# Patient Record
Sex: Female | Born: 2000 | Race: Black or African American | Hispanic: No | Marital: Single | Smoking: Never smoker
Health system: Southern US, Community
[De-identification: ages and names within clinical notes are randomized; demographics above are authoritative.]

## PROBLEM LIST (undated history)

## (undated) DIAGNOSIS — Z789 Other specified health status: Secondary | ICD-10-CM

## (undated) HISTORY — PX: TYMPANOSTOMY TUBE PLACEMENT: SHX32

---

## 2018-11-25 ENCOUNTER — Encounter (HOSPITAL_COMMUNITY): Payer: Self-pay | Admitting: Emergency Medicine

## 2018-11-25 ENCOUNTER — Other Ambulatory Visit: Payer: Self-pay

## 2018-11-25 ENCOUNTER — Emergency Department (HOSPITAL_COMMUNITY)
Admission: EM | Admit: 2018-11-25 | Discharge: 2018-11-25 | Disposition: A | Discharge status: Home / Self Care | Payer: Medicaid Other | Attending: Emergency Medicine | Admitting: Emergency Medicine

## 2018-11-25 DIAGNOSIS — K0889 Other specified disorders of teeth and supporting structures: Secondary | ICD-10-CM

## 2018-11-25 DIAGNOSIS — K047 Periapical abscess without sinus: Secondary | ICD-10-CM

## 2018-11-25 MED ORDER — PENICILLIN V POTASSIUM 250 MG PO TABS
500.0000 mg | ORAL_TABLET | Freq: Once | ORAL | Status: AC
  Administered 2018-11-25: 500 mg via ORAL
  Filled 2018-11-25: qty 2

## 2018-11-25 MED ORDER — NAPROXEN 500 MG PO TABS: 500.0000 mg | ORAL_TABLET | Freq: Two times a day (BID) | ORAL | 0 refills | Status: DC

## 2018-11-25 MED ORDER — NAPROXEN 250 MG PO TABS
500.0000 mg | ORAL_TABLET | Freq: Once | ORAL | Status: AC
  Administered 2018-11-25: 21:00:00 500 mg via ORAL
  Filled 2018-11-25: qty 2

## 2018-11-25 MED ORDER — PENICILLIN V POTASSIUM 500 MG PO TABS: 500.0000 mg | ORAL_TABLET | Freq: Four times a day (QID) | ORAL | 0 refills | Status: AC

## 2018-11-25 NOTE — ED Provider Notes (Signed)
[facility] Provider Note   CSN: [id number] Arrival date & time: 11/25/18  1910    History   Chief Complaint Chief Complaint  Patient presents with  . Dental Pain    HPI Laurie Jenkins is a 18 y.o. female presents for evaluation of left upper dental pain.  She reports she has had pain in the left upper tooth for a while but states that she feels like it worsened today.  She was told that she had a crack in her tooth and that would eventually need a root canal.  She states that she has not been able to get an appointment with her dentist and states that the earliest they will consider her is 12/16/2018.  She comes into the ED today because she feels like the pain has acutely worsened.  She states it is worse when she has cold liquid over the tooth.  She has been able to eat and drink.  She has taken Tylenol with no improvement in pain.  She is also applying Orajel and clove oil with no improvement.  She has not noted any fevers.  She has not any facial swelling, difficulty breathing, vomiting.     The history is provided by the patient.    History reviewed. No pertinent past medical history.  There are no active problems to display for this patient.   History reviewed. No pertinent surgical history.   OB History   No obstetric history on file.      Home Medications    Prior to Admission medications   Medication Sig Start Date End Date Taking? Authorizing Provider  naproxen (NAPROSYN) 500 MG tablet Take 1 tablet (500 mg total) by mouth 2 (two) times daily. 11/25/18   Graciella FreerLayden, Rhealyn Cullen A, PA-C  penicillin v potassium (VEETID) 500 MG tablet Take 1 tablet (500 mg total) by mouth 4 (four) times daily for 7 days. 11/25/18 12/02/18  Maxwell CaulLayden, Dajaun Goldring A, PA-C    Family History No family history on file.  Social History Social History   Tobacco Use  . Smoking status: Not on file  Substance Use Topics  . Alcohol use: Not on file  . Drug use:  Not on file     Allergies   Patient has no known allergies.   Review of Systems Review of Systems  Constitutional: Negative for fever.  HENT: Positive for dental problem.   Respiratory: Negative for shortness of breath.   Gastrointestinal: Negative for vomiting.  All other systems reviewed and are negative.    Physical Exam Updated Vital Signs BP 124/83   Pulse 80   Temp 98.2 F (36.8 C) (Oral)   Resp 18   SpO2 100%   Physical Exam Vitals signs and nursing note reviewed.  Constitutional:      Appearance: She is well-developed.  HENT:     Head: Normocephalic and atraumatic.     Comments: Face is symmetric in appearance with no overlying warmth, erythema, edema.    Mouth/Throat:     Pharynx: Oropharynx is clear.      Comments: Airway is patent, phonation is intact. Uvula is midline.  No trismus. Eyes:     General: No scleral icterus.       Right eye: No discharge.        Left eye: No discharge.     Conjunctiva/sclera: Conjunctivae normal.  Pulmonary:     Effort: Pulmonary effort is normal.  Skin:    General: Skin is warm and dry.  Neurological:     Mental Status: She is alert.  Psychiatric:        Speech: Speech normal.        Behavior: Behavior normal.      ED Treatments / Results  Labs (all labs ordered are listed, but only abnormal results are displayed) Labs Reviewed - No data to display  EKG None  Radiology No results found.  Procedures Procedures (including critical care time)  Medications Ordered in ED Medications  naproxen (NAPROSYN) tablet 500 mg (500 mg Oral Given 11/25/18 2045)  penicillin v potassium (VEETID) tablet 500 mg (500 mg Oral Given 11/25/18 2045)     Initial Impression / Assessment and Plan / ED Course  I have reviewed the triage vital signs and the nursing notes.  Pertinent labs & imaging results that were available during my care of the patient were reviewed by me and considered in my medical decision making (see  chart for details).        18 y.o. F  presents with weeks of dental pain that worsened today. No evidence of abscess requiring immediate incision and drainage. Exam not concerning for Ludwig's angina or pharyngeal abscess. Will treat with Penicillin. Patient with no known drug allergies.  I did offer patient dental block here in the ED but she declined.  Patient instructed to follow-up with dentist referral provided. Stable for discharge at this time. Strict return precautions discussed. Patient expresses understanding and agreement to plan.  Portions of this note were generated with [employer]. Dictation errors may occur despite best attempts at proofreading.  Final Clinical Impressions(s) / ED Diagnoses   Final diagnoses:  Pain, dental  Dental abscess    ED Discharge Orders         Ordered    penicillin v potassium (VEETID) 500 MG tablet  4 times daily     11/25/18 2032    naproxen (NAPROSYN) 500 MG tablet  2 times daily     11/25/18 2032           Desma Mcgregor 11/25/18 2242    Margette Fast, MD 11/26/18 [phone]

## 2018-11-25 NOTE — ED Triage Notes (Signed)
Pt reports Left upper dental pain that has been ongoing for a while, worsened today. States she was told she needs a root canal but cannot have it done until September. Has tried otc pain meds and orajel without relief. Pt tearful during triage.

## 2018-11-25 NOTE — ED Notes (Signed)
Discharge instructions discussed with pt pt verbalized understanding no questions at this time

## 2018-11-25 NOTE — Discharge Instructions (Signed)
Take antibiotics as directed. Please take all of your antibiotics until finished.  Take naproxen as directed.  The exam and treatment you received today has been provided on an emergency basis only. This is not a substitute for complete medical or dental care. If your problem worsens or new symptoms (problems) appear, and you are unable to arrange prompt follow-up care with your dentist, call or return to this location. If you do not have a dentist, please follow-up with one on the list provided  CALL YOUR DENTIST OR RETURN IMMEDIATELY IF you develop a fever, rash, difficulty breathing or swallowing, neck or facial swelling, or other potentially serious concerns.   Please follow-up with one of the dental clinics provided to you below or in your paperwork. Call and tell them you were seen in the Emergency Dept and arrange for an appointment. You may have to call multiple places in order to find a place to be seen.  Dental Assistance If the dentist on-call cannot see you, please use the resources below:   Patients with Medicaid: [place] [facility] [place], [place] [address], [phone]  If unable to pay, or uninsured, contact [facility] [phone]) or [place] [phone] in [place], [place] in [facility]) to become qualified for the adult dental clinic  Other [place]- [place], [place], [place], [address]    [phone], Ext. [phone]    2nd and 4th Thursday of the month at 6:30am    10 clients each day by appointment, can sometimes see walk-in     patients if someone does not show for an appointment [place], [place], [place], [address]    [phone] [facility]- [address], [place], [place], [address]    [phone]  [place] Department- [phone] [facility]  Department- [phone]

## 2018-12-24 ENCOUNTER — Encounter (HOSPITAL_COMMUNITY): Payer: Self-pay | Admitting: Emergency Medicine

## 2018-12-24 ENCOUNTER — Other Ambulatory Visit: Payer: Self-pay

## 2018-12-24 ENCOUNTER — Emergency Department (HOSPITAL_COMMUNITY)
Admission: EM | Admit: 2018-12-24 | Discharge: 2018-12-24 | Disposition: A | Discharge status: Home / Self Care | Payer: Medicaid Other | Attending: Emergency Medicine | Admitting: Emergency Medicine

## 2018-12-24 DIAGNOSIS — K0889 Other specified disorders of teeth and supporting structures: Secondary | ICD-10-CM | POA: Insufficient documentation

## 2018-12-24 DIAGNOSIS — M67432 Ganglion, left wrist: Secondary | ICD-10-CM | POA: Insufficient documentation

## 2018-12-24 DIAGNOSIS — M6740 Ganglion, unspecified site: Secondary | ICD-10-CM

## 2018-12-24 MED ORDER — PENICILLIN V POTASSIUM 500 MG PO TABS: 500.0000 mg | ORAL_TABLET | Freq: Four times a day (QID) | ORAL | 0 refills | Status: AC

## 2018-12-24 MED ORDER — TRAMADOL HCL 50 MG PO TABS: 50.0000 mg | ORAL_TABLET | Freq: Four times a day (QID) | ORAL | 0 refills | Status: DC | PRN

## 2018-12-24 NOTE — ED Notes (Signed)
Updated on wait for treatment room. 

## 2018-12-24 NOTE — ED Provider Notes (Signed)
[facility]   CSN: [id number] Arrival date & time: 12/24/18  1718     History   Chief Complaint Chief Complaint  Patient presents with  . Dental Pain  . Cyst    HPI Laurie Jenkins is a 18 y.o. female.     HPI   [age] YOF with several days or bilateralupper dental pina. This stated on left posterior. She has had this before and was prescribed antibiotics which helped her symptoms. She denies fever, swelling, or trauma. She has an upcoming dental appointment.   Also has a nodule on her left dorsal wrist at the base of the thumb. Minor tenderness to palpation. No redness fever or warmth.   Dental pain bialteral ganglion cyst left wrist History reviewed. No pertinent past medical history.  There are no active problems to display for this patient.   History reviewed. No pertinent surgical history.   OB History   No obstetric history on file.      Home Medications    Prior to Admission medications   Medication Sig Start Date End Date Taking? Authorizing Provider  naproxen (NAPROSYN) 500 MG tablet Take 1 tablet (500 mg total) by mouth 2 (two) times daily. 11/25/18   Graciella FreerLayden, Lindsey A, PA-C  penicillin v potassium (VEETID) 500 MG tablet Take 1 tablet (500 mg total) by mouth 4 (four) times daily for 7 days. 12/24/18 12/31/18  Dilpreet Faires, Tinnie GensJeffrey, PA-C  traMADol (ULTRAM) 50 MG tablet Take 1 tablet (50 mg total) by mouth every 6 (six) hours as needed. 12/24/18   Eyvonne MechanicHedges, Garnette Greb, PA-C    Family History No family history on file.  Social History Social History   Tobacco Use  . Smoking status: Never Smoker  . Smokeless tobacco: Never Used  Substance Use Topics  . Alcohol use: Never    Frequency: Never  . Drug use: Never     Allergies   Patient has no known allergies.   Review of Systems Review of Systems  All other systems reviewed and are negative.    Physical Exam Updated Vital Signs BP 115/74 (BP Location:  Right Arm)   Pulse 73   Temp 98.4 F (36.9 C) (Oral)   Resp 16   LMP 11/29/2018   SpO2 100%   Physical Exam Vitals signs and nursing note reviewed.  Constitutional:      Appearance: She is well-developed.  HENT:     Head: Normocephalic and atraumatic.     Comments: Dentition within normal limits - no gumline swelling or tenderness- floor of the mouth soft Eyes:     General: No scleral icterus.       Right eye: No discharge.        Left eye: No discharge.     Conjunctiva/sclera: Conjunctivae normal.     Pupils: Pupils are equal, round, and reactive to light.  Neck:     Musculoskeletal: Normal range of motion.     Vascular: No JVD.     Trachea: No tracheal deviation.  Pulmonary:     Effort: Pulmonary effort is normal.     Breath sounds: No stridor.  Musculoskeletal:     Comments: Small mobile nodule to left dorsal wrist at the base of the thumb  Neurological:     Mental Status: She is alert and oriented to person, place, and time.     Coordination: Coordination normal.  Psychiatric:        Behavior: Behavior normal.  Thought Content: Thought content normal.        Judgment: Judgment normal.      ED Treatments / Results  Labs (all labs ordered are listed, but only abnormal results are displayed) Labs Reviewed - No data to display  EKG None  Radiology No results found.  Procedures Procedures (including critical care time)  Medications Ordered in ED Medications - No data to display   Initial Impression / Assessment and Plan / ED Course  I have reviewed the triage vital signs and the nursing notes.  Pertinent labs & imaging results that were available during my care of the patient were reviewed by me and considered in my medical decision making (see chart for details).        Patient with dental pain, no swelling or obvious abscess but given history of presumed infection previously and similar pain with treat with antibiotic. She is not preg or  breastfeeding. She also has ganglion cyst. She will be referred to hand specialist. Return precautions given. Will follow up with dentist.   Final Clinical Impressions(s) / ED Diagnoses   Final diagnoses:  Ganglion cyst  Pain, dental    ED Discharge Orders         Ordered    penicillin v potassium (VEETID) 500 MG tablet  4 times daily     12/24/18 2200    traMADol (ULTRAM) 50 MG tablet  Every 6 hours PRN     12/24/18 2200           Okey Regal, PA-C 12/27/18 1001    Drenda Freeze, MD 12/30/18 [phone]

## 2018-12-24 NOTE — Discharge Instructions (Addendum)
Please read attached information. If you experience any new or worsening signs or symptoms please return to the emergency room for evaluation. Please follow-up with your primary care provider or specialist as discussed. Please use medication prescribed only as directed and discontinue taking if you have any concerning signs or symptoms.   °

## 2018-12-24 NOTE — ED Triage Notes (Signed)
C/o bilateral upper dental pain x several weeks-1 month.  Also reports "knot' on L wrist that she noticed yesterday.

## 2019-01-14 ENCOUNTER — Emergency Department (HOSPITAL_COMMUNITY)
Admission: EM | Admit: 2019-01-14 | Discharge: 2019-01-14 | Disposition: A | Discharge status: Home / Self Care | Payer: Medicaid Other | Attending: Emergency Medicine | Admitting: Emergency Medicine

## 2019-01-14 ENCOUNTER — Other Ambulatory Visit: Payer: Self-pay

## 2019-01-14 DIAGNOSIS — K0889 Other specified disorders of teeth and supporting structures: Secondary | ICD-10-CM | POA: Diagnosis present

## 2019-01-14 DIAGNOSIS — Z79899 Other long term (current) drug therapy: Secondary | ICD-10-CM | POA: Diagnosis not present

## 2019-01-14 MED ORDER — PENICILLIN V POTASSIUM 500 MG PO TABS: 500.0000 mg | ORAL_TABLET | Freq: Four times a day (QID) | ORAL | 0 refills | Status: AC

## 2019-01-14 MED ORDER — FLUCONAZOLE 100 MG PO TABS: 200.0000 mg | ORAL_TABLET | Freq: Every day | ORAL | 0 refills | Status: AC

## 2019-01-14 NOTE — ED Triage Notes (Signed)
Pt here for evaluation of dental pain x several weeks. Pt sts she needs a couple root canals and has a problem with her wisdom tooth. Pt has been using oragel and advil without relief. Is unable to call to make a dentist appointment until tomorrow.

## 2019-01-14 NOTE — Discharge Instructions (Addendum)
Take 600 mg of Advil liquid gels with 500 mg of Tylenol every 6 hours for pain. Rinse with Listerine after every meal. Take penicillin as prescribed and complete the full course. Eat yogurt daily or take Culturelle probiotic to prevent a yeast infection. If you develop a yeast infection, take one Diflucan pill.  Repeat in 1 week if you continue to have symptoms.

## 2019-01-14 NOTE — ED Provider Notes (Signed)
[facility] EMERGENCY DEPARTMENT Provider Note   CSN: [id number] Arrival date & time: 01/14/19  1627     History   Chief Complaint Chief Complaint  Patient presents with  . Dental Pain    HPI Laurie Jenkins is a 18 y.o. female.     18yo female with left and right upper dental pain, ongoing for several weeks, progressively worsening.  Denies fever, drainage, trauma.  No pain with opening her mouth.  No other complaints or concerns.  Patient states that she is supposed to have a root canal on either upper molar, plans to call her dentist in the morning to get this scheduled.     No past medical history on file.  There are no active problems to display for this patient.   No past surgical history on file.   OB History   No obstetric history on file.      Home Medications    Prior to Admission medications   Medication Sig Start Date End Date Taking? Authorizing Provider  fluconazole (DIFLUCAN) 100 MG tablet Take 2 tablets (200 mg total) by mouth daily for 2 days. Take 1 tablet if you develop a yeast infection. Repeat dose in 1 week if symptoms continue. 01/14/19 01/16/19  Tacy Learn, PA-C  naproxen (NAPROSYN) 500 MG tablet Take 1 tablet (500 mg total) by mouth 2 (two) times daily. 11/25/18   [place] Lanius A, PA-C  penicillin v potassium (VEETID) 500 MG tablet Take 1 tablet (500 mg total) by mouth 4 (four) times daily for 7 days. 01/14/19 01/21/19  Tacy Learn, PA-C  traMADol (ULTRAM) 50 MG tablet Take 1 tablet (50 mg total) by mouth every 6 (six) hours as needed. 12/24/18   Okey Regal, PA-C    Family History No family history on file.  Social History Social History   Tobacco Use  . Smoking status: Never Smoker  . Smokeless tobacco: Never Used  Substance Use Topics  . Alcohol use: Never    Frequency: Never  . Drug use: Never     Allergies   Patient has no known allergies.   Review of Systems Review of Systems   Constitutional: Negative for chills and fever.  HENT: Positive for dental problem. Negative for trouble swallowing and voice change.   Gastrointestinal: Negative for vomiting.  Musculoskeletal: Negative for neck pain and neck stiffness.  Skin: Negative for rash and wound.  Allergic/Immunologic: Negative for immunocompromised state.  Neurological: Negative for headaches.  Hematological: Negative for adenopathy.  Psychiatric/Behavioral: Negative for confusion.  All other systems reviewed and are negative.    Physical Exam Updated Vital Signs BP 132/77 (BP Location: Right Arm)   Pulse 90   Temp 98.4 F (36.9 C) (Oral)   Resp 16   SpO2 100%   Physical Exam Vitals signs and nursing note reviewed.  Constitutional:      General: She is not in acute distress.    Appearance: She is well-developed. She is not diaphoretic.  HENT:     Head: Normocephalic and atraumatic.     Jaw: No trismus.     Mouth/Throat:     Mouth: Mucous membranes are moist.     Pharynx: Oropharynx is clear. No oropharyngeal exudate or posterior oropharyngeal erythema.      Comments: Decay to left and right upper molars without obvious abscess, drainage, gingival swelling. Pulmonary:     Effort: Pulmonary effort is normal.  Neurological:     Mental Status: She is alert and  oriented to person, place, and time.  Psychiatric:        Behavior: Behavior normal.      ED Treatments / Results  Labs (all labs ordered are listed, but only abnormal results are displayed) Labs Reviewed - No data to display  EKG None  Radiology No results found.  Procedures Procedures (including critical care time)  Medications Ordered in ED Medications - No data to display   Initial Impression / Assessment and Plan / ED Course  I have reviewed the triage vital signs and the nursing notes.  Pertinent labs & imaging results that were available during my care of the patient were reviewed by me and considered in my medical  decision making (see chart for details).  Clinical Course as of Jan 14 2028  Wed Jan 14, 2019  2642 18 year old female with bilateral upper tooth pain progressively worsening over the past several weeks.  Denies trauma, fever, drainage.  Patient states she supposed have a root canal and needs to call her dentist and will do so for second the morning.  On exam patient has decay to upper molars without obvious abscess.  Patient was placed on penicillin, recommend Advil and Tylenol for pain.  Given Diflucan in case she develops a yeast infection as requested.   [LM]    Clinical Course User Index [LM] Jeannie Fend, PA-C      Final Clinical Impressions(s) / ED Diagnoses   Final diagnoses:  Pain, dental    ED Discharge Orders         Ordered    penicillin v potassium (VEETID) 500 MG tablet  4 times daily     01/14/19 2026    fluconazole (DIFLUCAN) 100 MG tablet  Daily     01/14/19 2026           Jeannie Fend, PA-C 01/14/19 2029    Alvira Monday, MD 01/15/19 1227

## 2019-01-18 ENCOUNTER — Encounter (HOSPITAL_COMMUNITY): Payer: Self-pay | Admitting: Emergency Medicine

## 2019-01-18 ENCOUNTER — Other Ambulatory Visit: Payer: Self-pay

## 2019-01-18 ENCOUNTER — Emergency Department (HOSPITAL_COMMUNITY)
Admission: EM | Admit: 2019-01-18 | Discharge: 2019-01-18 | Disposition: A | Discharge status: Home / Self Care | Payer: Medicaid Other | Attending: Emergency Medicine | Admitting: Emergency Medicine

## 2019-01-18 DIAGNOSIS — K0889 Other specified disorders of teeth and supporting structures: Secondary | ICD-10-CM | POA: Diagnosis present

## 2019-01-18 DIAGNOSIS — K029 Dental caries, unspecified: Secondary | ICD-10-CM | POA: Diagnosis not present

## 2019-01-18 MED ORDER — HYDROCODONE-ACETAMINOPHEN 5-325 MG PO TABS
2.0000 | ORAL_TABLET | Freq: Once | ORAL | Status: AC
  Administered 2019-01-18: 2 via ORAL
  Filled 2019-01-18: qty 2

## 2019-01-18 MED ORDER — CHLORHEXIDINE GLUCONATE 0.12 % MT SOLN: 15.0000 mL | Freq: Two times a day (BID) | OROMUCOSAL | 0 refills | Status: DC

## 2019-01-18 NOTE — ED Triage Notes (Signed)
Pt. Stated, Donnald Garre had a toothache, I was here a couple of days ago.I was given antibiotic and Im still in pain.

## 2019-01-18 NOTE — Discharge Instructions (Addendum)
You have been diagnosed today with dental pain due to dental caries.  At this time there does not appear to be the presence of an emergent medical condition, however there is always the potential for conditions to change. Please read and follow the below instructions.  Please return to the Emergency Department immediately for any new or worsening symptoms or if your symptoms do not improve within 3 days. Please be sure to follow up with your Primary Care Provider within one week regarding your visit today; please call their office to schedule an appointment even if you are feeling better for a follow-up visit. You have been given pain medication today called Norco.  Do not drive or operate machinery for the rest of the day as it will make you drowsy.  Do not drink alcohol or take other sedating medications today as this may worsen side effects. Call the dentist Dr. Drinda Butts office on your discharge paperwork after you leave the emergency department today to schedule an "emergency department follow-up visit.".  You need to be seen by a dentist for definitive dental care. Please continue to take your antibiotic penicillin as previously prescribed until complete to avoid dental infection. You may use the mouth rinse Peridex as prescribed to help with your symptoms.  Do not swallow Peridex.  Rinse and spit this medication.  Get help right away if: You cannot open your mouth. You are having trouble breathing or swallowing. You have a fever. Your face, neck, or jaw is swollen. You have any new/concerning or worsening symptoms  Please read the additional information packets attached to your discharge summary.  Do not take your medicine if  develop an itchy rash, swelling in your mouth or lips, or difficulty breathing; call 911 and seek immediate emergency medical attention if this occurs.  Note: Portions of this text may have been transcribed using voice recognition software. Every effort was made  to ensure accuracy; however, inadvertent computerized transcription errors may still be present.

## 2019-01-18 NOTE — ED Notes (Signed)
Patient called and stated that she was supposed to get Vicodin but it wasn't called in. I called and spoke to Margrett Rud and he said he didn't tell her that he was calling narcotics called in as he doesn't give narcotics for tooth pain. She kept going on and on about getting narcotics. She said the nurse told her that she would get some. She kept asking what she should do as she had gotten them before for tooth pain. I recommended calling a dentist tomorrow morning and she stated " We aren't all privileged enough to do that." She then hung up on me.

## 2019-01-18 NOTE — ED Provider Notes (Signed)
[facility] EMERGENCY DEPARTMENT Provider Note   CSN: [id number] Arrival date & time: 01/18/19  0740     History   Chief Complaint Chief Complaint  Patient presents with  . Dental Pain    HPI Laurie Jenkins is a 18 y.o. female otherwise healthy presents today for bilateral upper dental pain that has been present for approximately 1-1/2 months.  Patient originally presented for pain on 11/25/2018, she attempted to follow-up with general dentistry however they have been unable to get an appointment.  On 11/25/2018 she was treated with Pen VK and naproxen with temporary improvement of symptoms.  She again presented on 01/14/2019 for the same symptoms, treated with Pen VK and again discharged.  She reports that she did not fill the second prescription until yesterday, reports pain is now constant severe bilateral upper dental pain worsened with chewing and temporarily relieved with Advil, denies radiation of pain.  Patient denies fever/chills, eye pain, facial swelling, difficulty swallowing, drooling, voice change, nausea/vomiting, abdominal pain or any additional concerns.     HPI  History reviewed. No pertinent past medical history.  There are no active problems to display for this patient.   History reviewed. No pertinent surgical history.   OB History   No obstetric history on file.      Home Medications    Prior to Admission medications   Medication Sig Start Date End Date Taking? Authorizing Provider  ibuprofen (ADVIL) 200 MG tablet Take 600 mg by mouth every 6 (six) hours as needed for mild pain or moderate pain.   Yes [provider]  penicillin v potassium (VEETID) 500 MG tablet Take 1 tablet (500 mg total) by mouth 4 (four) times daily for 7 days. 01/14/19 01/21/19 Yes Jeannie Fend, PA-C  chlorhexidine (PERIDEX) 0.12 % solution Use as directed 15 mLs in the mouth or throat 2 (two) times daily. 01/18/19   Harlene Salts A, PA-C  naproxen  (NAPROSYN) 500 MG tablet Take 1 tablet (500 mg total) by mouth 2 (two) times daily. Patient not taking: Reported on 01/18/2019 11/25/18   Graciella Freer A, PA-C  traMADol (ULTRAM) 50 MG tablet Take 1 tablet (50 mg total) by mouth every 6 (six) hours as needed. Patient not taking: Reported on 01/18/2019 12/24/18   Eyvonne Mechanic, PA-C    Family History No family history on file.  Social History Social History   Tobacco Use  . Smoking status: Never Smoker  . Smokeless tobacco: Never Used  Substance Use Topics  . Alcohol use: Never    Frequency: Never  . Drug use: Never     Allergies   Patient has no known allergies.   Review of Systems Review of Systems Ten systems are reviewed and are negative for acute change except as noted in the HPI   Physical Exam Updated Vital Signs BP (!) 141/95 (BP Location: Right Arm)   Pulse (!) 102   Temp 98.5 F (36.9 C)   Resp 17   LMP 12/25/2018   SpO2 98%   Physical Exam Constitutional:      General: She is not in acute distress.    Appearance: Normal appearance. She is well-developed. She is not ill-appearing or diaphoretic.  HENT:     Head: Normocephalic and atraumatic.     Jaw: There is normal jaw occlusion. No trismus.     Right Ear: External ear normal.     Left Ear: External ear normal.     Nose: Nose normal. No  rhinorrhea.     Mouth/Throat:      Comments: Patient with overall poor dentition, 2 large cavities noted of tooth #2 and tooth #15.  The patient has normal phonation and is in control of secretions. No stridor.  Midline uvula without edema. Soft palate rises symmetrically. No tonsillar erythema, swelling or exudates. Tongue protrusion is normal, floor of mouth is soft. No trismus. No creptius on neck palpation. No gingival erythema or fluctuance noted. Mucus membranes moist. Eyes:     General: Vision grossly intact. Gaze aligned appropriately.     Extraocular Movements: Extraocular movements intact.     Pupils: Pupils  are equal, round, and reactive to light.     Comments: No pain with EOM  Neck:     Musculoskeletal: Normal range of motion and neck supple. No neck rigidity.     Trachea: Trachea and phonation normal. No tracheal tenderness or tracheal deviation.  Pulmonary:     Effort: Pulmonary effort is normal. No respiratory distress.  Abdominal:     General: There is no distension.     Palpations: Abdomen is soft.     Tenderness: There is no abdominal tenderness. There is no guarding or rebound.  Musculoskeletal: Normal range of motion.  Skin:    General: Skin is warm and dry.  Neurological:     Mental Status: She is alert.     GCS: GCS eye subscore is 4. GCS verbal subscore is 5. GCS motor subscore is 6.     Comments: Speech is clear and goal oriented, follows commands Major Cranial nerves without deficit, no facial droop Moves extremities without ataxia, coordination intact  Psychiatric:        Behavior: Behavior normal.    ED Treatments / Results  Labs (all labs ordered are listed, but only abnormal results are displayed) Labs Reviewed - No data to display  EKG None  Radiology No results found.  Procedures Procedures (including critical care time)  Medications Ordered in ED Medications  HYDROcodone-acetaminophen (NORCO/VICODIN) 5-325 MG per tablet 2 tablet (2 tablets Oral Given 01/18/19 0933)     Initial Impression / Assessment and Plan / ED Course  I have reviewed the triage vital signs and the nursing notes.  Pertinent labs & imaging results that were available during my care of the patient were reviewed by me and considered in my medical decision making (see chart for details).     18 year old female with continued bilateral upper dental pain.  She began taking penicillin VK yesterday which had been prescribed 4 days ago.  She has been calling local dentists and unable to establish a follow-up appointment.  She has 2 large dental surface cavities to teeth numbers 2 and 15.   No signs or symptoms of dental abscess, no swelling/erythema/tenderness of the gums.  Patient is well-appearing, afebrile, nontoxic, speaking well.  Patient able to swallow without pain.  No signs of swelling or concern for Ludwig's angina/Peritonsilar abscess/Retropharyngeal abscess or other deep tissue infections.  No pain with extraocular motion.  No sign of swelling of the neck, patient has good range of motion of the neck, no trismus.  Patient will be given referral to on-call general dentistry Dr. Haig Prophet and encouraged to call today to establish an ED follow-up appointment in hopes to get patient definitive dental care.  Patient has been given 2 Norco here for dental pain, I have discussed narcotic precautions with the patient and she states understanding, her mother is here to drive her home today.  She denies chance of pregnancy and does not wish to be tested today.  As patient just began her penicillin VK yesterday I have encouraged her to continue with this antibiotic, do not feel there is an indication for changing antibiotic at this point, this does not appear as a failed outpatient treatment.  Patient will continue OTC anti-inflammatories, ice/heat compressions and Peridex at home.  At this time there does not appear to be any evidence of an acute emergency medical condition and the patient appears stable for discharge with appropriate outpatient follow up. Diagnosis was discussed with patient who verbalizes understanding of care plan and is agreeable to discharge. I have discussed return precautions with patient who verbalizes understanding of return precautions. Patient encouraged to follow-up with their PCP and dentist. All questions answered.  Patient has been discharged in good condition.   Note: Portions of this report may have been transcribed using voice recognition software. Every effort was made to ensure accuracy; however, inadvertent computerized transcription errors may still be  present. Final Clinical Impressions(s) / ED Diagnoses   Final diagnoses:  Pain due to dental caries    ED Discharge Orders         Ordered    chlorhexidine (PERIDEX) 0.12 % solution  2 times daily     01/18/19 0953           Bill SalinasMorelli, Artesha Wemhoff A, PA-C 01/18/19 [id number]    Milagros Lollykstra, Richard S, MD 01/19/19 [phone]

## 2019-10-15 DIAGNOSIS — Z419 Encounter for procedure for purposes other than remedying health state, unspecified: Secondary | ICD-10-CM | POA: Diagnosis not present

## 2019-11-15 DIAGNOSIS — Z419 Encounter for procedure for purposes other than remedying health state, unspecified: Secondary | ICD-10-CM | POA: Diagnosis not present

## 2019-12-16 DIAGNOSIS — Z419 Encounter for procedure for purposes other than remedying health state, unspecified: Secondary | ICD-10-CM | POA: Diagnosis not present

## 2020-01-15 DIAGNOSIS — Z419 Encounter for procedure for purposes other than remedying health state, unspecified: Secondary | ICD-10-CM | POA: Diagnosis not present

## 2020-01-20 ENCOUNTER — Other Ambulatory Visit: Payer: Self-pay

## 2020-01-20 ENCOUNTER — Ambulatory Visit (INDEPENDENT_AMBULATORY_CARE_PROVIDER_SITE_OTHER): Payer: Medicaid Other | Admitting: Certified Nurse Midwife

## 2020-01-20 ENCOUNTER — Encounter: Payer: Self-pay | Admitting: Certified Nurse Midwife

## 2020-01-20 ENCOUNTER — Other Ambulatory Visit (HOSPITAL_COMMUNITY)
Admission: RE | Admit: 2020-01-20 | Discharge: 2020-01-20 | Disposition: A | Discharge status: Home / Self Care | Payer: Medicaid Other | Source: Ambulatory Visit | Attending: Certified Nurse Midwife | Admitting: Certified Nurse Midwife

## 2020-01-20 VITALS — BP 105/67 | HR 71 | Temp 98.1°F | Ht 61.0 in | Wt 113.2 lb

## 2020-01-20 DIAGNOSIS — Z30017 Encounter for initial prescription of implantable subdermal contraceptive: Secondary | ICD-10-CM

## 2020-01-20 DIAGNOSIS — Z01419 Encounter for gynecological examination (general) (routine) without abnormal findings: Secondary | ICD-10-CM

## 2020-01-20 DIAGNOSIS — Z113 Encounter for screening for infections with a predominantly sexual mode of transmission: Secondary | ICD-10-CM

## 2020-01-20 DIAGNOSIS — Z124 Encounter for screening for malignant neoplasm of cervix: Secondary | ICD-10-CM | POA: Diagnosis not present

## 2020-01-20 LAB — POCT URINE PREGNANCY: Preg Test, Ur: NEGATIVE

## 2020-01-20 MED ORDER — ETONOGESTREL 68 MG ~~LOC~~ IMPL
68.0000 mg | DRUG_IMPLANT | Freq: Once | SUBCUTANEOUS | Status: AC
  Administered 2020-01-20: 68 mg via SUBCUTANEOUS

## 2020-01-20 NOTE — Patient Instructions (Signed)

## 2020-01-20 NOTE — Progress Notes (Signed)
GYNECOLOGY ANNUAL PREVENTATIVE CARE ENCOUNTER NOTE  History:     Laurie Jenkins is a 19 y.o. G0P0000 female here for a new patient annual gynecologic exam.  Current complaints: desires nexplanon.   Denies abnormal vaginal bleeding, discharge, pelvic pain, problems with intercourse or other gynecologic concerns.  Patient uses condoms for birth control. Reports last IC over 2 weeks ago.    Gynecologic History Patient's last menstrual period was 01/15/2020 (approximate). Contraception: condoms Last Pap: patient never had pap prior to today   Obstetric History OB History  Gravida Para Term Preterm AB Living  0 0 0 0 0 0  SAB TAB Ectopic Multiple Live Births  0 0 0 0 0    History reviewed. No pertinent past medical history.  History reviewed. No pertinent surgical history.  Current Outpatient Medications on File Prior to Visit  Medication Sig Dispense Refill  . ibuprofen (ADVIL) 200 MG tablet Take 600 mg by mouth every 6 (six) hours as needed for mild pain or moderate pain.    . chlorhexidine (PERIDEX) 0.12 % solution Use as directed 15 mLs in the mouth or throat 2 (two) times daily. 120 mL 0  . naproxen (NAPROSYN) 500 MG tablet Take 1 tablet (500 mg total) by mouth 2 (two) times daily. (Patient not taking: Reported on 01/18/2019) 10 tablet 0  . traMADol (ULTRAM) 50 MG tablet Take 1 tablet (50 mg total) by mouth every 6 (six) hours as needed. (Patient not taking: Reported on 01/18/2019) 15 tablet 0   No current facility-administered medications on file prior to visit.    No Known Allergies  Social History:  reports that she has never smoked. She has never used smokeless tobacco. She reports that she does not drink alcohol and does not use drugs.  Family History  Problem Relation Age of Onset  . Hypertension Father   . Hypertension Mother     The following portions of the patient's history were reviewed and updated as appropriate: allergies, current medications, past family  history, past medical history, past social history, past surgical history and problem list.  Review of Systems Pertinent items noted in HPI and remainder of comprehensive ROS otherwise negative.  Physical Exam:  BP 105/67 (BP Location: Left Arm, Patient Position: Sitting, Cuff Size: Normal)   Pulse 71   Temp 98.1 F (36.7 C) (Oral)   Ht 5\' 1"  (1.549 m)   Wt 113 lb 3.2 oz (51.3 kg)   LMP 01/15/2020 (Approximate)   BMI 21.39 kg/m  CONSTITUTIONAL: Well-developed, well-nourished female in no acute distress.  HENT:  Normocephalic, atraumatic, External right and left ear normal. Oropharynx is clear and moist EYES: Conjunctivae and EOM are normal. Pupils are equal, round, and reactive to light. NECK: Normal range of motion, supple, no masses.  Normal thyroid.  SKIN: Skin is warm and dry. No rash noted. Not diaphoretic. No erythema. No pallor. MUSCULOSKELETAL: Normal range of motion. No tenderness.  No cyanosis, clubbing, or edema.  2+ distal pulses. NEUROLOGIC: Alert and oriented to person, place, and time. Normal reflexes, muscle tone coordination.  PSYCHIATRIC: Normal mood and affect. Normal behavior. Normal judgment and thought content. CARDIOVASCULAR: Normal heart rate noted, regular rhythm RESPIRATORY: Clear to auscultation bilaterally. Effort and breath sounds normal, no problems with respiration noted. BREASTS: Symmetric in size. No masses, tenderness, skin changes, nipple drainage, or lymphadenopathy bilaterally. Performed in the presence of a chaperone. ABDOMEN: Soft, no distention noted.  No tenderness, rebound or guarding.  PELVIC: Normal appearing external genitalia  and urethral meatus; normal appearing vaginal mucosa and cervix.  No abnormal discharge noted.  Pap smear obtained.  Normal uterine size, no other palpable masses, no uterine or adnexal tenderness.  Performed in the presence of a chaperone.     PROCEDURES:  Nexplanon Insertion Procedure Patient was given informed  consent, she signed consent form.  Patient does understand that irregular bleeding is a very common side effect of this medication. She was advised to have backup contraception for one week after placement. Pregnancy test in clinic today was negative.  Appropriate time out taken.  Patient's left arm was prepped and draped in the usual sterile fashion.. The ruler used to measure and mark insertion area.  Patient was prepped with alcohol swab and then injected with 3 ml of 1% lidocaine.  She was prepped with betadine, Nexplanon removed from packaging,  Device confirmed in needle, then inserted full length of needle and withdrawn per handbook instructions. Nexplanon was able to palpated in the patient's arm; patient palpated the insert herself. There was minimal blood loss.  Patient insertion site covered with guaze and a pressure bandage to reduce any bruising.  The patient tolerated the procedure well and was given post procedure instructions.    Assessment and Plan:    1. Women's annual routine gynecological examination - Normal well woman examination  - Cytology - PAP( [place]) - Cervicovaginal ancillary only( [place]) - HIV Antibody (routine testing w rflx) - RPR  2. Pap smear for cervical cancer screening - Cytology - PAP( [place])  3. Screen for STD (sexually transmitted disease) - Patient request STD screening today  - Cervicovaginal ancillary only( [place]) - HIV Antibody (routine testing w rflx) - RPR - Hepatitis C Antibody  4. Encounter for initial prescription of Nexplanon - see above procedure note   Will follow up results of pap smear/STD screening and manage accordingly. Routine preventative health maintenance measures emphasized. Please refer to After Visit Summary for other counseling recommendations.      Sharyon Cable, CNM [facility] [employer], [facility]

## 2020-01-21 LAB — HEPATITIS C ANTIBODY: Hep C Virus Ab: 0.1 s/co ratio (ref 0.0–0.9)

## 2020-01-21 LAB — CERVICOVAGINAL ANCILLARY ONLY
Chlamydia: NEGATIVE
Comment: NEGATIVE
Comment: NEGATIVE
Comment: NORMAL
Neisseria Gonorrhea: NEGATIVE
Trichomonas: NEGATIVE

## 2020-01-21 LAB — HIV ANTIBODY (ROUTINE TESTING W REFLEX): HIV Screen 4th Generation wRfx: NONREACTIVE

## 2020-01-21 LAB — RPR: RPR Ser Ql: NONREACTIVE

## 2020-01-24 LAB — CYTOLOGY - PAP
Comment: NEGATIVE
Diagnosis: HIGH — AB
High risk HPV: NEGATIVE

## 2020-01-25 ENCOUNTER — Telehealth: Payer: Self-pay | Admitting: *Deleted

## 2020-01-25 NOTE — Telephone Encounter (Signed)
-----   Message from Sharyon Cable, CNM sent at 01/24/2020  8:38 PM EDT ----- Please call patient to notify of results and need for colposcopy. Please schedule patient for colposcopy.   Steward Drone CNM

## 2020-01-28 ENCOUNTER — Encounter: Payer: Self-pay | Admitting: General Practice

## 2020-02-15 DIAGNOSIS — Z419 Encounter for procedure for purposes other than remedying health state, unspecified: Secondary | ICD-10-CM | POA: Diagnosis not present

## 2020-03-08 ENCOUNTER — Ambulatory Visit: Payer: Medicaid Other | Admitting: Obstetrics and Gynecology

## 2020-03-08 DIAGNOSIS — R87611 Atypical squamous cells cannot exclude high grade squamous intraepithelial lesion on cytologic smear of cervix (ASC-H): Secondary | ICD-10-CM | POA: Insufficient documentation

## 2020-03-16 DIAGNOSIS — Z419 Encounter for procedure for purposes other than remedying health state, unspecified: Secondary | ICD-10-CM | POA: Diagnosis not present

## 2020-04-16 DIAGNOSIS — Z419 Encounter for procedure for purposes other than remedying health state, unspecified: Secondary | ICD-10-CM | POA: Diagnosis not present

## 2020-05-17 DIAGNOSIS — Z419 Encounter for procedure for purposes other than remedying health state, unspecified: Secondary | ICD-10-CM | POA: Diagnosis not present

## 2020-06-14 DIAGNOSIS — Z419 Encounter for procedure for purposes other than remedying health state, unspecified: Secondary | ICD-10-CM | POA: Diagnosis not present

## 2020-06-20 ENCOUNTER — Ambulatory Visit (INDEPENDENT_AMBULATORY_CARE_PROVIDER_SITE_OTHER): Payer: Medicaid Other | Admitting: Obstetrics and Gynecology

## 2020-06-20 ENCOUNTER — Encounter: Payer: Self-pay | Admitting: Obstetrics and Gynecology

## 2020-06-20 ENCOUNTER — Other Ambulatory Visit (HOSPITAL_COMMUNITY)
Admission: RE | Admit: 2020-06-20 | Discharge: 2020-06-20 | Disposition: A | Discharge status: Home / Self Care | Payer: Medicaid Other | Source: Ambulatory Visit | Attending: Obstetrics and Gynecology | Admitting: Obstetrics and Gynecology

## 2020-06-20 ENCOUNTER — Other Ambulatory Visit: Payer: Self-pay

## 2020-06-20 VITALS — BP 112/73 | HR 69 | Wt 108.8 lb

## 2020-06-20 DIAGNOSIS — Z658 Other specified problems related to psychosocial circumstances: Secondary | ICD-10-CM | POA: Diagnosis not present

## 2020-06-20 DIAGNOSIS — R87611 Atypical squamous cells cannot exclude high grade squamous intraepithelial lesion on cytologic smear of cervix (ASC-H): Secondary | ICD-10-CM | POA: Insufficient documentation

## 2020-06-20 LAB — POCT PREGNANCY, URINE: Preg Test, Ur: NEGATIVE

## 2020-06-20 NOTE — Addendum Note (Signed)
Addended by: Marjo Bicker on: 06/20/2020 04:35 PM   Modules accepted: Orders

## 2020-06-20 NOTE — Patient Instructions (Addendum)
If you are in need of transportation to get to and from your appointments in our office.  You can reach Transportation Services by calling [phone] Monday - Friday  7am-6pm.  Human Papillomavirus Quadrivalent Vaccine suspension for injection What is this medicine? HUMAN PAPILLOMAVIRUS VACCINE (HYOO muhn pap uh LOH muh vahy ruhs vak SEEN) is a vaccine. It is used to prevent infections of four types of the human papillomavirus. In women, the vaccine may lower your risk of getting cervical, vaginal, vulvar, or anal cancer and genital warts. In men, the vaccine may lower your risk of getting genital warts and anal cancer. You cannot get these diseases from the vaccine. This vaccine does not treat these diseases. This medicine may be used for other purposes; ask your health care provider or pharmacist if you have questions. COMMON BRAND NAME(S): Gardasil What should I tell my health care provider before I take this medicine? They need to know if you have any of these conditions:  fever or infection  hemophilia  HIV infection or AIDS  immune system problems  low platelet count  an unusual reaction to Human Papillomavirus Vaccine, yeast, other medicines, foods, dyes, or preservatives  pregnant or trying to get pregnant  breast-feeding How should I use this medicine? This vaccine is for injection in a muscle on your upper arm or thigh. It is given by a health care professional. Dennis Bast will be observed for 15 minutes after each dose. Sometimes, fainting happens after the vaccine is given. You may be asked to sit or lie down during the 15 minutes. Three doses are given. The second dose is given 2 months after the first dose. The last dose is given 4 months after the second dose. A copy of a Vaccine Information Statement will be given before each vaccination. Read this sheet carefully each time. The sheet may change frequently. Talk to your pediatrician regarding the use of this medicine in  children. While this drug may be prescribed for children as young as 61 years of age for selected conditions, precautions do apply. Overdosage: If you think you have taken too much of this medicine contact a poison control center or emergency room at once. NOTE: This medicine is only for you. Do not share this medicine with others. What if I miss a dose? All 3 doses of the vaccine should be given within 6 months. Remember to keep appointments for follow-up doses. Your health care provider will tell you when to return for the next vaccine. Ask your health care professional for advice if you are unable to keep an appointment or miss a scheduled dose. What may interact with this medicine?  other vaccines This list may not describe all possible interactions. Give your health care provider a list of all the medicines, herbs, non-prescription drugs, or dietary supplements you use. Also tell them if you smoke, drink alcohol, or use illegal drugs. Some items may interact with your medicine. What should I watch for while using this medicine? This vaccine may not fully protect everyone. Continue to have regular pelvic exams and cervical or anal cancer screenings as directed by your doctor. The Human Papillomavirus is a sexually transmitted disease. It can be passed by any kind of sexual activity that involves genital contact. The vaccine works best when given before you have any contact with the virus. Many people who have the virus do not have any signs or symptoms. Tell your doctor or health care professional if you have any reaction or unusual  symptom after getting the vaccine. What side effects may I notice from receiving this medicine? Side effects that you should report to your doctor or health care professional as soon as possible:  allergic reactions like skin rash, itching or hives, swelling of the face, lips, or tongue  breathing problems  feeling faint or lightheaded, falls Side effects that  usually do not require medical attention (report to your doctor or health care professional if they continue or are bothersome):  cough  dizziness  fever  headache  nausea  redness, warmth, swelling, pain, or itching at site where injected This list may not describe all possible side effects. Call your doctor for medical advice about side effects. You may report side effects to FDA at 1-800-FDA-1088. Where should I keep my medicine? This drug is given in a hospital or clinic and will not be stored at home. NOTE: This sheet is a summary. It may not cover all possible information. If you have questions about this medicine, talk to your doctor, pharmacist, or health care provider.  2021 Elsevier/Gold Standard (2013-05-25 13:14:33)

## 2020-06-20 NOTE — Progress Notes (Signed)
Patient with [facility] pap smear here for colposcopy  Patient given informed consent, signed copy in the chart, time out was performed.  Placed in lithotomy position. Cervix viewed with speculum and colposcope after application of acetic acid.   Colposcopy adequate?  yes Acetowhite lesions? 6 o'clock Punctation? no Mosaicism?  no Abnormal vasculature?  no Biopsies? 6 o'clock ECC? yes  COMMENTS:  Patient was given post procedure instructions.  She will return in 2 weeks for results. Benefits of Gardasil vaccine discussed. Patient uncertain if she has received it  Catalina Antigua, MD

## 2020-06-22 LAB — SURGICAL PATHOLOGY

## 2020-07-15 DIAGNOSIS — Z419 Encounter for procedure for purposes other than remedying health state, unspecified: Secondary | ICD-10-CM | POA: Diagnosis not present

## 2020-07-23 ENCOUNTER — Emergency Department (HOSPITAL_COMMUNITY)
Admission: EM | Admit: 2020-07-23 | Discharge: 2020-07-23 | Disposition: A | Discharge status: Home / Self Care | Payer: Medicaid Other | Attending: Emergency Medicine | Admitting: Emergency Medicine

## 2020-07-23 ENCOUNTER — Other Ambulatory Visit: Payer: Self-pay

## 2020-07-23 ENCOUNTER — Encounter (HOSPITAL_COMMUNITY): Payer: Self-pay

## 2020-07-23 DIAGNOSIS — Y92410 Unspecified street and highway as the place of occurrence of the external cause: Secondary | ICD-10-CM | POA: Diagnosis not present

## 2020-07-23 DIAGNOSIS — R000 Tachycardia, unspecified: Secondary | ICD-10-CM | POA: Diagnosis not present

## 2020-07-23 DIAGNOSIS — M545 Low back pain, unspecified: Secondary | ICD-10-CM | POA: Insufficient documentation

## 2020-07-23 DIAGNOSIS — R519 Headache, unspecified: Secondary | ICD-10-CM | POA: Diagnosis not present

## 2020-07-23 DIAGNOSIS — M25512 Pain in left shoulder: Secondary | ICD-10-CM | POA: Diagnosis not present

## 2020-07-23 DIAGNOSIS — M25511 Pain in right shoulder: Secondary | ICD-10-CM | POA: Diagnosis not present

## 2020-07-23 HISTORY — DX: Other specified health status: Z78.9

## 2020-07-23 MED ORDER — IBUPROFEN 400 MG PO TABS
600.0000 mg | ORAL_TABLET | Freq: Once | ORAL | Status: AC
  Administered 2020-07-23: 600 mg via ORAL
  Filled 2020-07-23: qty 1

## 2020-07-23 MED ORDER — ACETAMINOPHEN 325 MG PO TABS
650.0000 mg | ORAL_TABLET | Freq: Once | ORAL | Status: AC
  Administered 2020-07-23: 650 mg via ORAL
  Filled 2020-07-23: qty 2

## 2020-07-23 NOTE — ED Notes (Signed)
Patient discharge instructions reviewed with the patient. The patient verbalized understanding of instructions. Patient discharged. 
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
## 2020-07-23 NOTE — Discharge Instructions (Signed)
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
## 2020-07-26 ENCOUNTER — Other Ambulatory Visit: Payer: Self-pay

## 2020-07-26 ENCOUNTER — Emergency Department (HOSPITAL_COMMUNITY)
Admission: EM | Admit: 2020-07-26 | Discharge: 2020-07-27 | Disposition: A | Discharge status: Home / Self Care | Payer: No Typology Code available for payment source | Attending: Emergency Medicine | Admitting: Emergency Medicine

## 2020-07-26 ENCOUNTER — Encounter (HOSPITAL_COMMUNITY): Payer: Self-pay

## 2020-07-26 DIAGNOSIS — M542 Cervicalgia: Secondary | ICD-10-CM | POA: Diagnosis not present

## 2020-07-26 DIAGNOSIS — R519 Headache, unspecified: Secondary | ICD-10-CM | POA: Insufficient documentation

## 2020-07-26 DIAGNOSIS — M545 Low back pain, unspecified: Secondary | ICD-10-CM | POA: Insufficient documentation

## 2020-07-26 MED ORDER — DIPHENHYDRAMINE HCL 25 MG PO CAPS: 50.0000 mg | ORAL_CAPSULE | Freq: Once | ORAL | Status: DC

## 2020-07-26 MED ORDER — FAMOTIDINE 20 MG PO TABS: 20.0000 mg | ORAL_TABLET | Freq: Once | ORAL | Status: DC
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
## 2020-07-27 ENCOUNTER — Emergency Department (HOSPITAL_COMMUNITY): Payer: No Typology Code available for payment source

## 2020-07-27 ENCOUNTER — Telehealth: Payer: Self-pay | Admitting: Obstetrics and Gynecology

## 2020-07-27 DIAGNOSIS — R519 Headache, unspecified: Secondary | ICD-10-CM | POA: Diagnosis not present

## 2020-07-27 LAB — POC URINE PREG, ED: Preg Test, Ur: NEGATIVE

## 2020-07-27 MED ORDER — IBUPROFEN 600 MG PO TABS: 600.0000 mg | ORAL_TABLET | Freq: Four times a day (QID) | ORAL | 0 refills | Status: AC | PRN

## 2020-07-27 MED ORDER — CYCLOBENZAPRINE HCL 10 MG PO TABS: 10.0000 mg | ORAL_TABLET | Freq: Two times a day (BID) | ORAL | 0 refills | Status: AC | PRN

## 2020-07-27 MED ORDER — IBUPROFEN 400 MG PO TABS
600.0000 mg | ORAL_TABLET | Freq: Once | ORAL | Status: AC
  Administered 2020-07-27: 600 mg via ORAL
  Filled 2020-07-27: qty 1

## 2020-07-27 NOTE — Discharge Instructions (Signed)
Fortunately your x-ray did not shows any broken bone.  Please continue taking anti-inflammatory medication as needed for pain.  Please take Epson salt bath, alternate between heat and ice as needed for aches and pain.  Follow-up with orthopedist for further care as needed.

## 2020-07-27 NOTE — ED Notes (Signed)
Patient verbalizes understanding of discharge instructions. Opportunity for questioning and answers were provided. Armband removed by staff, pt discharged from ED and ambulated to lobby to return home.   
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
## 2020-07-27 NOTE — Telephone Encounter (Signed)
Attempted to reach patient about scheduling with Behavioral Health. Was not able to reach her. Will send a MyChart message.

## 2020-08-03 ENCOUNTER — Telehealth: Payer: Self-pay | Admitting: Obstetrics and Gynecology

## 2020-08-03 NOTE — Telephone Encounter (Signed)
Attempted to reach patient to get her scheduled for an appointment. Was not able to leave a voicemail. She does not have a mailbox on her phone.

## 2020-08-14 DIAGNOSIS — Z419 Encounter for procedure for purposes other than remedying health state, unspecified: Secondary | ICD-10-CM | POA: Diagnosis not present

## 2020-09-14 DIAGNOSIS — Z419 Encounter for procedure for purposes other than remedying health state, unspecified: Secondary | ICD-10-CM | POA: Diagnosis not present

## 2020-10-14 DIAGNOSIS — Z419 Encounter for procedure for purposes other than remedying health state, unspecified: Secondary | ICD-10-CM | POA: Diagnosis not present

## 2020-11-14 DIAGNOSIS — Z419 Encounter for procedure for purposes other than remedying health state, unspecified: Secondary | ICD-10-CM | POA: Diagnosis not present

## 2020-11-30 ENCOUNTER — Encounter: Payer: Self-pay | Admitting: Obstetrics and Gynecology

## 2020-12-15 DIAGNOSIS — Z419 Encounter for procedure for purposes other than remedying health state, unspecified: Secondary | ICD-10-CM | POA: Diagnosis not present

## 2021-01-14 DIAGNOSIS — Z419 Encounter for procedure for purposes other than remedying health state, unspecified: Secondary | ICD-10-CM | POA: Diagnosis not present

## 2021-02-14 DIAGNOSIS — Z419 Encounter for procedure for purposes other than remedying health state, unspecified: Secondary | ICD-10-CM | POA: Diagnosis not present

## 2021-03-16 DIAGNOSIS — Z419 Encounter for procedure for purposes other than remedying health state, unspecified: Secondary | ICD-10-CM | POA: Diagnosis not present

## 2021-04-16 DIAGNOSIS — Z419 Encounter for procedure for purposes other than remedying health state, unspecified: Secondary | ICD-10-CM | POA: Diagnosis not present

## 2021-05-17 DIAGNOSIS — Z419 Encounter for procedure for purposes other than remedying health state, unspecified: Secondary | ICD-10-CM | POA: Diagnosis not present

## 2021-06-14 DIAGNOSIS — Z419 Encounter for procedure for purposes other than remedying health state, unspecified: Secondary | ICD-10-CM | POA: Diagnosis not present

## 2021-07-15 DIAGNOSIS — Z419 Encounter for procedure for purposes other than remedying health state, unspecified: Secondary | ICD-10-CM | POA: Diagnosis not present

## 2021-08-14 DIAGNOSIS — Z419 Encounter for procedure for purposes other than remedying health state, unspecified: Secondary | ICD-10-CM | POA: Diagnosis not present

## 2021-09-14 DIAGNOSIS — Z419 Encounter for procedure for purposes other than remedying health state, unspecified: Secondary | ICD-10-CM | POA: Diagnosis not present

## 2021-10-14 DIAGNOSIS — Z419 Encounter for procedure for purposes other than remedying health state, unspecified: Secondary | ICD-10-CM | POA: Diagnosis not present

## 2021-11-14 DIAGNOSIS — Z419 Encounter for procedure for purposes other than remedying health state, unspecified: Secondary | ICD-10-CM | POA: Diagnosis not present
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
## 2021-12-15 DIAGNOSIS — Z419 Encounter for procedure for purposes other than remedying health state, unspecified: Secondary | ICD-10-CM | POA: Diagnosis not present

## 2022-01-14 DIAGNOSIS — Z419 Encounter for procedure for purposes other than remedying health state, unspecified: Secondary | ICD-10-CM | POA: Diagnosis not present

## 2022-02-14 DIAGNOSIS — Z419 Encounter for procedure for purposes other than remedying health state, unspecified: Secondary | ICD-10-CM | POA: Diagnosis not present

## 2022-03-16 DIAGNOSIS — Z419 Encounter for procedure for purposes other than remedying health state, unspecified: Secondary | ICD-10-CM | POA: Diagnosis not present

## 2022-04-16 DIAGNOSIS — Z419 Encounter for procedure for purposes other than remedying health state, unspecified: Secondary | ICD-10-CM | POA: Diagnosis not present

## 2022-05-17 DIAGNOSIS — Z419 Encounter for procedure for purposes other than remedying health state, unspecified: Secondary | ICD-10-CM | POA: Diagnosis not present

## 2022-06-15 DIAGNOSIS — Z419 Encounter for procedure for purposes other than remedying health state, unspecified: Secondary | ICD-10-CM | POA: Diagnosis not present

## 2022-07-16 DIAGNOSIS — Z419 Encounter for procedure for purposes other than remedying health state, unspecified: Secondary | ICD-10-CM | POA: Diagnosis not present

## 2022-08-15 DIAGNOSIS — Z419 Encounter for procedure for purposes other than remedying health state, unspecified: Secondary | ICD-10-CM | POA: Diagnosis not present

## 2022-09-15 DIAGNOSIS — Z419 Encounter for procedure for purposes other than remedying health state, unspecified: Secondary | ICD-10-CM | POA: Diagnosis not present

## 2022-10-15 DIAGNOSIS — Z419 Encounter for procedure for purposes other than remedying health state, unspecified: Secondary | ICD-10-CM | POA: Diagnosis not present

## 2022-11-15 DIAGNOSIS — Z419 Encounter for procedure for purposes other than remedying health state, unspecified: Secondary | ICD-10-CM | POA: Diagnosis not present

## 2022-12-16 DIAGNOSIS — Z419 Encounter for procedure for purposes other than remedying health state, unspecified: Secondary | ICD-10-CM | POA: Diagnosis not present

## 2023-01-15 DIAGNOSIS — Z419 Encounter for procedure for purposes other than remedying health state, unspecified: Secondary | ICD-10-CM | POA: Diagnosis not present

## 2023-02-15 DIAGNOSIS — Z419 Encounter for procedure for purposes other than remedying health state, unspecified: Secondary | ICD-10-CM | POA: Diagnosis not present

## 2023-03-17 DIAGNOSIS — Z419 Encounter for procedure for purposes other than remedying health state, unspecified: Secondary | ICD-10-CM | POA: Diagnosis not present

## 2023-04-17 DIAGNOSIS — Z419 Encounter for procedure for purposes other than remedying health state, unspecified: Secondary | ICD-10-CM | POA: Diagnosis not present

## 2023-05-18 DIAGNOSIS — Z419 Encounter for procedure for purposes other than remedying health state, unspecified: Secondary | ICD-10-CM | POA: Diagnosis not present

## 2023-06-15 DIAGNOSIS — Z419 Encounter for procedure for purposes other than remedying health state, unspecified: Secondary | ICD-10-CM | POA: Diagnosis not present

## 2023-07-27 DIAGNOSIS — Z419 Encounter for procedure for purposes other than remedying health state, unspecified: Secondary | ICD-10-CM | POA: Diagnosis not present

## 2023-08-26 DIAGNOSIS — Z419 Encounter for procedure for purposes other than remedying health state, unspecified: Secondary | ICD-10-CM | POA: Diagnosis not present

## 2023-09-26 DIAGNOSIS — Z419 Encounter for procedure for purposes other than remedying health state, unspecified: Secondary | ICD-10-CM | POA: Diagnosis not present

## 2023-10-26 DIAGNOSIS — Z419 Encounter for procedure for purposes other than remedying health state, unspecified: Secondary | ICD-10-CM | POA: Diagnosis not present

## 2023-11-26 DIAGNOSIS — Z419 Encounter for procedure for purposes other than remedying health state, unspecified: Secondary | ICD-10-CM | POA: Diagnosis not present

## 2023-12-27 DIAGNOSIS — Z419 Encounter for procedure for purposes other than remedying health state, unspecified: Secondary | ICD-10-CM | POA: Diagnosis not present

## 2024-03-27 DIAGNOSIS — Z419 Encounter for procedure for purposes other than remedying health state, unspecified: Secondary | ICD-10-CM | POA: Diagnosis not present
